# Patient Record
Sex: Female | Born: 1990 | Race: White | Hispanic: No | Marital: Married | Smoking: Never smoker
Health system: Southern US, Community
[De-identification: ages and names within clinical notes are randomized; demographics above are authoritative.]

## PROBLEM LIST (undated history)

## (undated) DIAGNOSIS — I959 Hypotension, unspecified: Secondary | ICD-10-CM

## (undated) HISTORY — PX: HIP ARTHROSCOPY W/ LABRAL REPAIR: SHX1750

---

## 2008-05-14 ENCOUNTER — Emergency Department (HOSPITAL_BASED_OUTPATIENT_CLINIC_OR_DEPARTMENT_OTHER): Admission: EM | Admit: 2008-05-14 | Discharge: 2008-05-14 | Payer: Self-pay | Admitting: Emergency Medicine

## 2009-05-14 IMAGING — CT CT HEAD W/O CM
2 series · 16 of 30 positions shown, 18 images · non-contrast
Comparison: None

CLINICAL DATA: Severe headache and seizure

CT HEAD WITHOUT CONTRAST
TECHNIQUE: Contiguous axial images were obtained from the base of
the skull through the vertex without contrast.

[Series 2: head 4.8 h37s · axial · 0.44mm/px · z∈[+1308,+1410]mm · 8 of 28 slices shown, 10 images]
[im 4/28  brain]
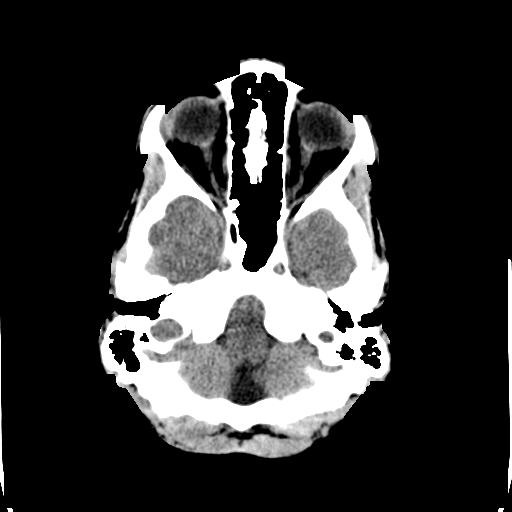
[im 4/28  bone]
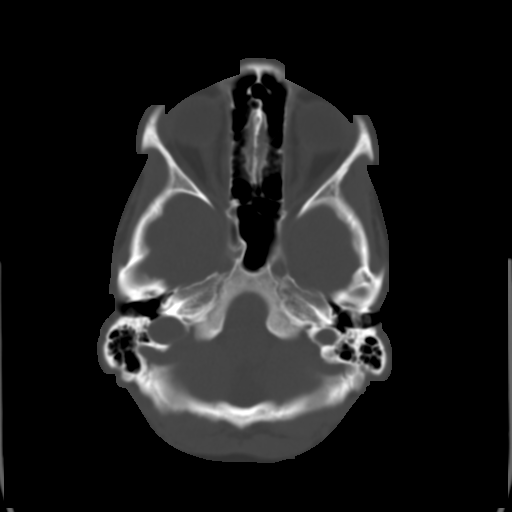
[im 7/28  brain]
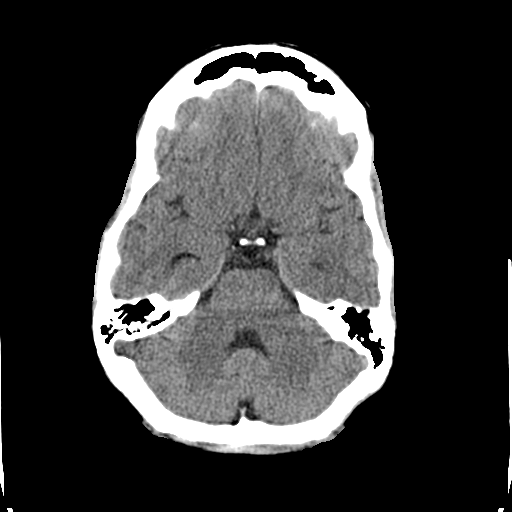
[im 10/28  brain]
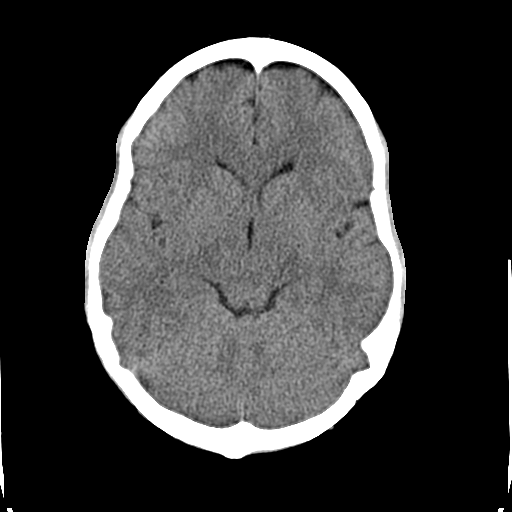
[im 13/28  brain]
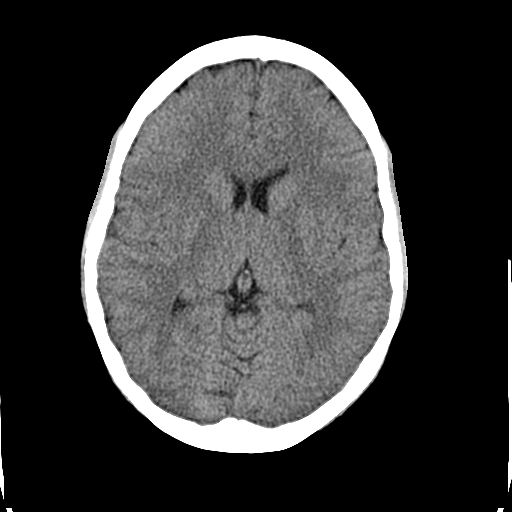
[im 16/28  brain]
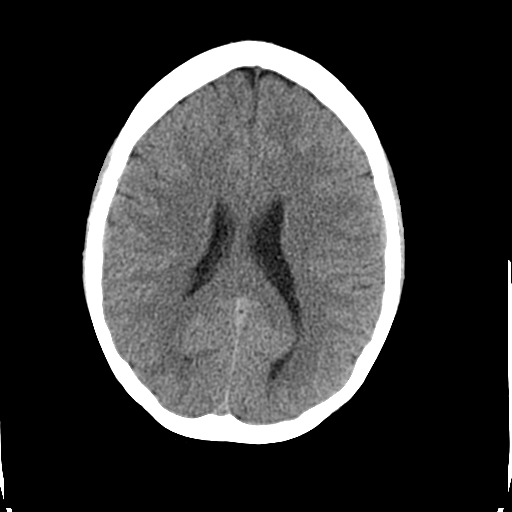
[im 16/28  bone]
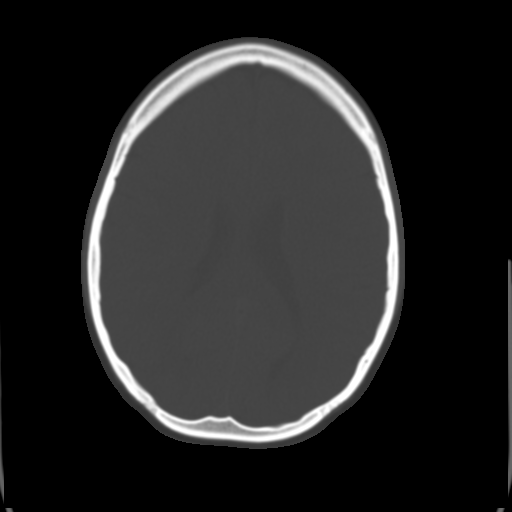
[im 19/28  brain]
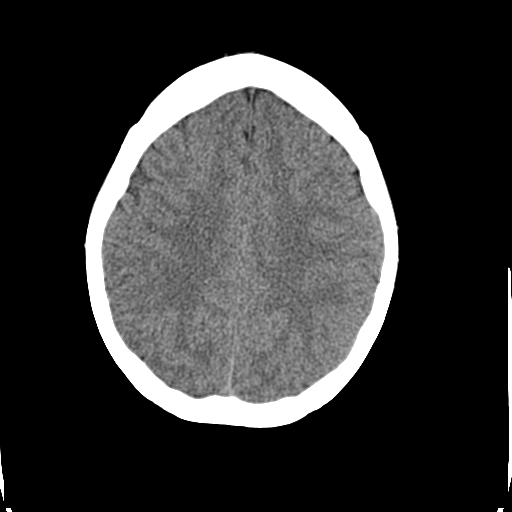
[im 22/28  brain]
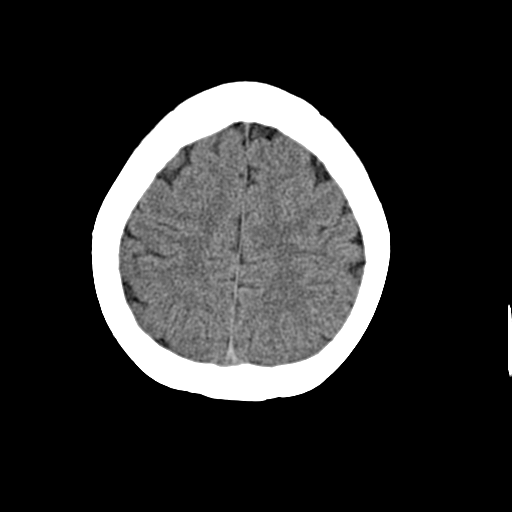
[im 25/28  brain]
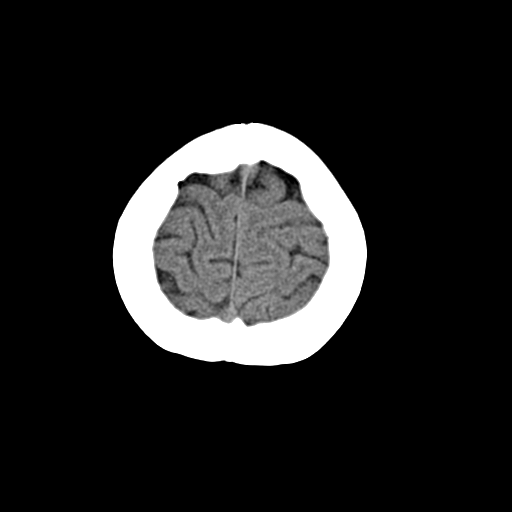

[Series 3: head 2.4 h60s bone · axial · 0.44mm/px · z∈[+1304,+1411]mm · 8 of 56 slices shown]
[im 6/56  bone]
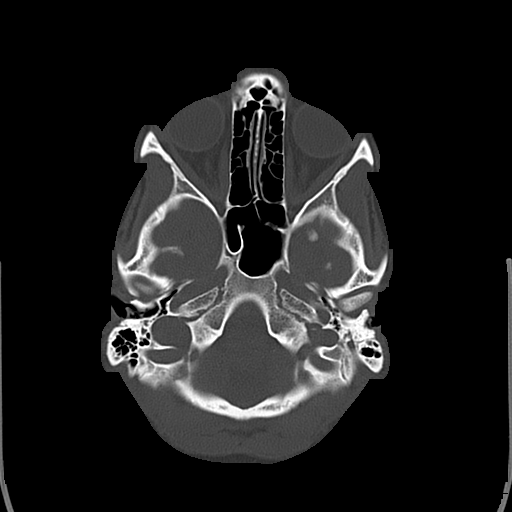
[im 12/56  bone]
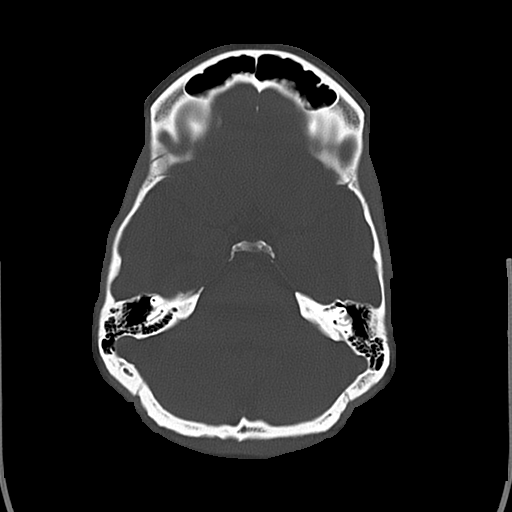
[im 18/56  bone]
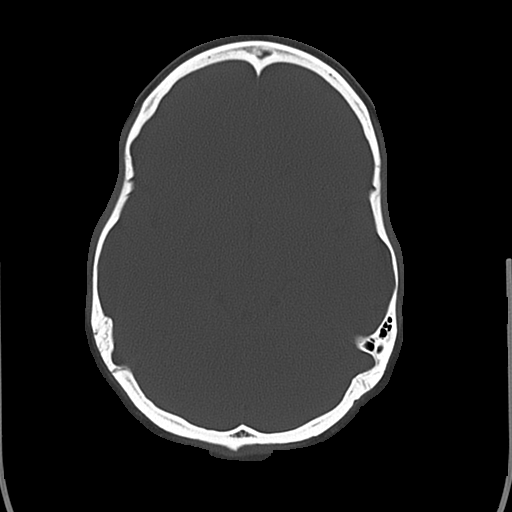
[im 24/56  bone]
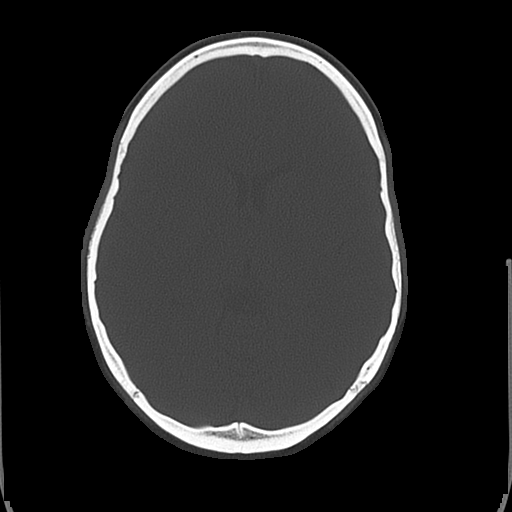
[im 32/56  bone]
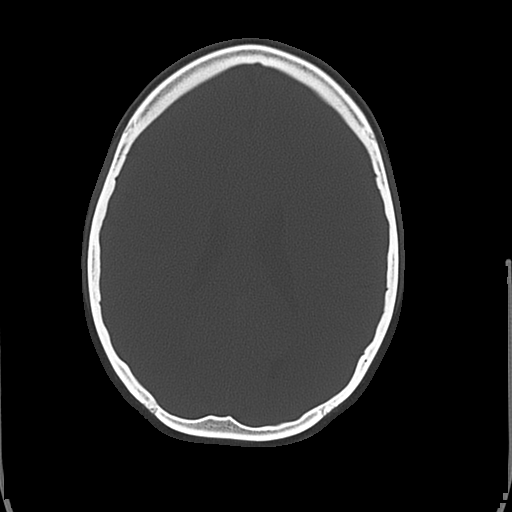
[im 38/56  bone]
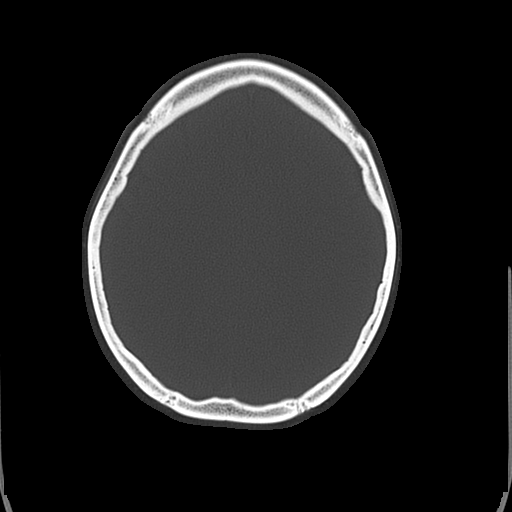
[im 44/56  bone]
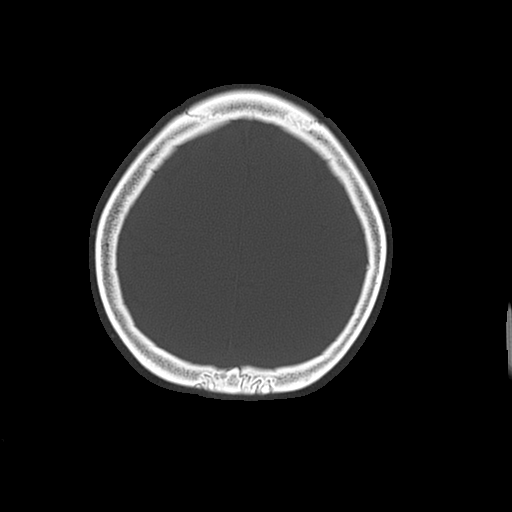
[im 50/56  bone]
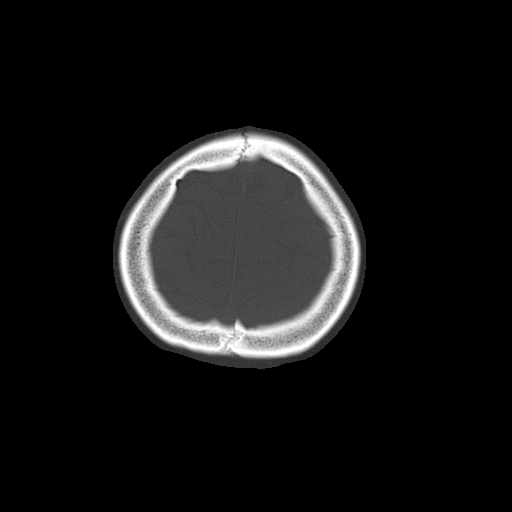

[16 of 30 positions shown; findings below may reference images not displayed]

FINDINGS: The cerebral and cerebellar parenchyma are symmetric and
normal appearance.  No acute intracranial hemorrhage or edema.  The
mastoid air cells and sinuses are clear
IMPRESSION: Negative for acute intracranial hemorrhage or edema.

## 2010-05-08 ENCOUNTER — Emergency Department (HOSPITAL_BASED_OUTPATIENT_CLINIC_OR_DEPARTMENT_OTHER): Admission: EM | Admit: 2010-05-08 | Discharge: 2010-05-08 | Payer: Self-pay | Admitting: Emergency Medicine

## 2014-09-13 ENCOUNTER — Emergency Department
Admission: EM | Admit: 2014-09-13 | Discharge: 2014-09-13 | Disposition: A | Discharge status: Home / Self Care | Payer: Commercial Managed Care - PPO | Source: Home / Self Care | Attending: Emergency Medicine | Admitting: Emergency Medicine

## 2014-09-13 ENCOUNTER — Encounter: Payer: Self-pay | Admitting: Emergency Medicine

## 2014-09-13 DIAGNOSIS — G43009 Migraine without aura, not intractable, without status migrainosus: Secondary | ICD-10-CM

## 2014-09-13 HISTORY — DX: Hypotension, unspecified: I95.9

## 2014-09-13 MED ORDER — SUMATRIPTAN SUCCINATE 100 MG PO TABS: 100.0000 mg | ORAL_TABLET | ORAL | Status: AC | PRN

## 2014-09-13 MED ORDER — PROMETHAZINE HCL 25 MG/ML IJ SOLN
25.0000 mg | Freq: Four times a day (QID) | INTRAMUSCULAR | Status: DC | PRN
  Administered 2014-09-13: 25 mg via INTRAMUSCULAR

## 2014-09-13 MED ORDER — KETOROLAC TROMETHAMINE 60 MG/2ML IM SOLN
60.0000 mg | Freq: Once | INTRAMUSCULAR | Status: AC
  Administered 2014-09-13: 60 mg via INTRAMUSCULAR

## 2014-09-13 MED ORDER — DIPHENHYDRAMINE HCL 50 MG/ML IJ SOLN
25.0000 mg | Freq: Once | INTRAMUSCULAR | Status: AC
  Administered 2014-09-13: 25 mg via INTRAMUSCULAR

## 2014-09-13 NOTE — ED Notes (Signed)
C/o recurrent headaches this week.  Taking Motrin 800mg , Percocet and Phenergan without relief.

## 2014-09-13 NOTE — Discharge Instructions (Signed)

## 2014-09-13 NOTE — ED Provider Notes (Signed)
CSN: [id number]     Arrival date & time 09/13/14  1142 History   First MD Initiated Contact with Patient 09/13/14 1212     Chief Complaint  Patient presents with  . Headache   (Consider location/radiation/quality/duration/timing/severity/associated sxs/prior Treatment) Patient is a 23 y.o. female presenting with headaches. The history is provided by the patient. No language interpreter was used.  Headache Pain location:  Generalized Quality:  Sharp Radiates to:  Does not radiate Severity currently:  9/10 Severity at highest:  9/10 Timing:  Constant Progression:  Worsening Chronicity:  New Similar to prior headaches: yes   Relieved by:  Nothing Worsened by:  Nothing tried Ineffective treatments:  None tried Associated symptoms: blurred vision, loss of balance and vomiting   Associated symptoms: no focal weakness     Past Medical History  Diagnosis Date  . Hypotension    Past Surgical History  Procedure Laterality Date  . Hip arthroscopy w/ labral repair     Family History  Problem Relation Age of Onset  . Diabetes Mother   . Migraines Mother   . Thyroid disease Sister    History  Substance Use Topics  . Smoking status: Never Smoker   . Smokeless tobacco: Not on file  . Alcohol Use: 1.5 oz/week    3 drink(s) per week   OB History   Grav Para Term Preterm Abortions TAB SAB Ect Mult Living                 Review of Systems  Eyes: Positive for blurred vision.  Gastrointestinal: Positive for vomiting.  Neurological: Positive for headaches and loss of balance. Negative for focal weakness.  All other systems reviewed and are negative.   Allergies  Ceclor  Home Medications   Prior to Admission medications   Medication Sig Start Date End Date Taking? Authorizing Provider  levonorgestrel (MIRENA) 20 MCG/24HR IUD 1 each by Intrauterine route once.   Yes Historical Provider, MD  SUMAtriptan (IMITREX) 100 MG tablet Take 1 tablet (100 mg total) by mouth every 2  (two) hours as needed for migraine or headache. May repeat in 2 hours if headache persists or recurs. 09/13/14   Elson AreasLeslie K Truitt Cruey, PA-C   BP 121/75  Pulse 77  Temp(Src) 98.1 F (36.7 C) (Oral)  Resp 16  Ht 5\' 8"  (1.727 m)  Wt 172 lb (78.019 kg)  BMI 26.16 kg/m2  SpO2 98% Physical Exam  Nursing note and vitals reviewed. Constitutional: She is oriented to person, place, and time. She appears well-developed and well-nourished.  HENT:  Head: Normocephalic and atraumatic.  Right Ear: External ear normal.  Left Ear: External ear normal.  Mouth/Throat: Oropharynx is clear and moist.  Eyes: Conjunctivae and EOM are normal. Pupils are equal, round, and reactive to light.  Neck: Normal range of motion.  Cardiovascular: Normal rate and normal heart sounds.   Pulmonary/Chest: Effort normal.  Abdominal: She exhibits no distension.  Musculoskeletal: Normal range of motion.  Neurological: She is alert and oriented to person, place, and time. No cranial nerve deficit. Coordination normal.  Psychiatric: She has a normal mood and affect.    ED Course  Procedures (including critical care time) Labs Review Labs Reviewed - No data to display  Imaging Review No results found.   MDM normal neurologic exam no red flags. Headache sounds consistent with migraines I will treat patient with modified migraine cocktail of Toradol Benadryl and Phenergan IM patient is given a prescription for Imitrex 100 mg to try  for headaches at home she is advised to follow-up with her primary care provider for recheck in 2-3 days.    1. Nonintractable migraine, unspecified migraine type    Imitrex Avs    Elson AreasLeslie K Blake Goya, PA-C 09/13/14 1229

## 2020-09-15 ENCOUNTER — Telehealth (HOSPITAL_COMMUNITY): Payer: Self-pay

## 2020-09-15 NOTE — Telephone Encounter (Signed)
Called to Discuss with patient about Covid symptoms and the use of the monoclonal antibody infusion for those with mild to moderate Covid symptoms and at a high risk of hospitalization.     Pt is qualified for this infusion due to co-morbid conditions and/or a member of an at-risk group.    Patient declines infusion at this time as she has decided to get her infusion at a clinic in [place]. Patient appreciated the return call.      Iley Breeden Loyola Mast, RN
